# Patient Record
Sex: Female | Born: 1990 | Race: White | Hispanic: No | Marital: Married | Smoking: Former smoker
Health system: Southern US, Community
[De-identification: ages and names within clinical notes are randomized; demographics above are authoritative.]

## PROBLEM LIST (undated history)

## (undated) DIAGNOSIS — Z789 Other specified health status: Secondary | ICD-10-CM

## (undated) HISTORY — PX: CHOLECYSTECTOMY: SHX55

---

## 2017-10-15 ENCOUNTER — Other Ambulatory Visit (HOSPITAL_COMMUNITY): Payer: Self-pay | Admitting: Obstetrics and Gynecology

## 2017-10-15 DIAGNOSIS — Z3689 Encounter for other specified antenatal screening: Secondary | ICD-10-CM

## 2017-10-15 DIAGNOSIS — Z3A21 21 weeks gestation of pregnancy: Secondary | ICD-10-CM

## 2017-10-15 DIAGNOSIS — Z8774 Personal history of (corrected) congenital malformations of heart and circulatory system: Secondary | ICD-10-CM

## 2017-10-17 ENCOUNTER — Encounter (HOSPITAL_COMMUNITY): Payer: Self-pay | Admitting: *Deleted

## 2017-10-19 ENCOUNTER — Encounter (HOSPITAL_COMMUNITY): Payer: Self-pay

## 2017-10-19 ENCOUNTER — Ambulatory Visit (HOSPITAL_COMMUNITY)
Admission: RE | Admit: 2017-10-19 | Discharge: 2017-10-19 | Disposition: A | Discharge status: Home / Self Care | Payer: BLUE CROSS/BLUE SHIELD | Source: Ambulatory Visit | Attending: Obstetrics and Gynecology | Admitting: Obstetrics and Gynecology

## 2017-10-19 ENCOUNTER — Other Ambulatory Visit (HOSPITAL_COMMUNITY): Payer: Self-pay | Admitting: *Deleted

## 2017-10-19 ENCOUNTER — Other Ambulatory Visit (HOSPITAL_COMMUNITY): Payer: Self-pay | Admitting: Obstetrics and Gynecology

## 2017-10-19 DIAGNOSIS — Z8759 Personal history of other complications of pregnancy, childbirth and the puerperium: Secondary | ICD-10-CM

## 2017-10-19 DIAGNOSIS — O09292 Supervision of pregnancy with other poor reproductive or obstetric history, second trimester: Secondary | ICD-10-CM | POA: Insufficient documentation

## 2017-10-19 DIAGNOSIS — Z3A21 21 weeks gestation of pregnancy: Secondary | ICD-10-CM | POA: Diagnosis not present

## 2017-10-19 DIAGNOSIS — O09299 Supervision of pregnancy with other poor reproductive or obstetric history, unspecified trimester: Secondary | ICD-10-CM

## 2017-10-19 DIAGNOSIS — Z3689 Encounter for other specified antenatal screening: Secondary | ICD-10-CM

## 2017-10-19 DIAGNOSIS — Z8774 Personal history of (corrected) congenital malformations of heart and circulatory system: Secondary | ICD-10-CM

## 2017-10-19 DIAGNOSIS — Z7982 Long term (current) use of aspirin: Secondary | ICD-10-CM | POA: Diagnosis not present

## 2017-10-19 DIAGNOSIS — Z8279 Family history of other congenital malformations, deformations and chromosomal abnormalities: Secondary | ICD-10-CM | POA: Insufficient documentation

## 2017-10-19 HISTORY — DX: Other specified health status: Z78.9

## 2017-10-19 NOTE — Consult Note (Signed)
MFM Consult, Staff Note:   By way of consultation, I spoke to Mrs. Kunda regarding her pregnancy history of both intrauterine fetal demise as well as the history of prior child with congenital heart defect.  I explained to her that her obstetrical history places her at increased risk for recurrence of intrauterine growth restriction as well as intrauterine fetal demise.  In addition, her pregnancy history is suggestive for antiphospholipid antibody syndrome.    I explained to her that because she has a history of intrauterine intrauterine fetal demise, there is actually a preventive medicine to possibly reduce incidence of recurrence and subsequent pregnancy namely this one.  This medication is called low-dose aspirin 81 mg tablet taken daily. Accordingly, she was informed me that you had already placed her on  this medication and that she knows to take it until [redacted] weeks gestation.  I also spoke to her at length about the implications of antiphospholipid syndrome in pregnancy.  As you well know, women with this diagnosis are at substantial risk for the development of thromboembolism as well as adverse pregnancy complications such as recurrent miscarriage, stillbirth, placental insufficiency and preeclampsia.  The diagnosis of antiphospholipid syndrome should be based upon the presence of one of these clinical factors as well as a positive laboratory test for one of the antiphospholipid antibodies such as the lupus anticoagulant, anticardiolipin antibody or anti-beta 2-glycoprotein antibodies.   We accordingly ordered an APS laboratory panel today and should she have this antibody present, I would presumptively recommend initiation of low molecular weight heparin (LMWH or Lovenox) at 40mg   qd at least until a 2nd confirmatory APS panel was achieved to fully establish the diagnosis of antiphospholipid antibody syndrome.  That being said, I am not beginning this medication today in hopes that she does  not need the LMWH and that her test may be negative.  I feel that in absence of prior hx of a VTE, deferring initiation of such medication until a positive result is demonstrated is safest.  Pregnancy management in setting of hx of IUFD typically includes regular prenatal visits throughout pregnancy, serial ultrasound measurements of fetal growth and antenatal surveillance at around 32 weeks of gestation.  Delivery at 39 weeks of pregnancy should be anticipated.      SUMMARY OF RECOMMENDATIONS: 1.  Continuation of aspirin 81 mg orally daily now until 37 weeks to help prevention of intrauterine growth restriction and intrauterine fetal demise. 2.  We drew APS panel today to ensure that she does not have APS syndrome 3.  We have arranged for her to have interval growth ultrasounds every 4 weeks throughout the mid and late trimesters of pregnancy and these have been arranged at 24 weeks of gestation all the way through delivery. 4.  Antenatal testing should begin at 32 weeks. 5. Given that she has a prior child with a heart defect, I arranged for a screening fetal echocardiogram.   6.  Lastly, we recommend delivery by 39 weeks of gestational age.    TIME OF CONSULTATION:   30 minutes were spent in evaluation and counseling of your patient, more than 50% of this time was spent in direct face-to-face counseling.  Thank you for allowing us to contribute to the care of your patient.    Thank you,  Louann SjogrenJeffrey Morgan Gaynelle Arabianenney   Denney, Louann SjogrenJeffrey Morgan, MD, MS, FACOG Assistant Professor Section of Maternal-Fetal Medicine [facility]

## 2017-10-19 NOTE — Consult Note (Signed)
MFM Consult, Staff Note:   By way of consultation, I spoke to Kellie Krause regarding her pregnancy history of both intrauterine fetal demise as well as the history of prior child with congenital heart defect.  I explained to her that her obstetrical history places her at increased risk for recurrence of intrauterine growth restriction as well as intrauterine fetal demise.  In addition, her pregnancy history is suggestive for antiphospholipid antibody syndrome.    I explained to her that because she has a history of intrauterine intrauterine fetal demise, there is actually a preventive medicine to possibly reduce incidence of recurrence and subsequent pregnancy namely this one.  This medication is called low-dose aspirin 81 mg tablet taken daily. Accordingly, she was informed me that you had already placed her on  this medication and that she knows to take it until [redacted] weeks gestation.  I also spoke to her at length about the implications of antiphospholipid syndrome in pregnancy.  As you well know, women with this diagnosis are at substantial risk for the development of thromboembolism as well as adverse pregnancy complications such as recurrent miscarriage, stillbirth, placental insufficiency and preeclampsia.  The diagnosis of antiphospholipid syndrome should be based upon the presence of one of these clinical factors as well as a positive laboratory test for one of the antiphospholipid antibodies such as the lupus anticoagulant, anticardiolipin antibody or anti-beta 2-glycoprotein antibodies.   We accordingly ordered an APS laboratory panel today and should she have this antibody present, I would presumptively recommend initiation of low molecular weight heparin (LMWH or Lovenox) at 40mg [place] qd at least until a 2nd confirmatory APS panel was achieved to fully establish the diagnosis of antiphospholipid antibody syndrome.  That being said, I am not beginning this medication today in hopes that she does  not need the LMWH and that her test may be negative.  I feel that in absence of prior hx of a VTE, deferring initiation of such medication until a positive result is demonstrated is safest.  Pregnancy management in setting of hx of IUFD typically includes regular prenatal visits throughout pregnancy, serial ultrasound measurements of fetal growth and antenatal surveillance at around 32 weeks of gestation.  Delivery at 39 weeks of pregnancy should be anticipated.      SUMMARY OF RECOMMENDATIONS: 1.  Continuation of aspirin 81 mg orally daily now until 37 weeks to help prevention of intrauterine growth restriction and intrauterine fetal demise. 2.  We drew APS panel today to ensure that she does not have APS syndrome 3.  We have arranged for her to have interval growth ultrasounds every 4 weeks throughout the mid and late trimesters of pregnancy and these have been arranged at 24 weeks of gestation all the way through delivery. 4.  Antenatal testing should begin at 32 weeks. 5. Given that she has a prior child with a heart defect, I arranged for a screening fetal echocardiogram.   6.  Lastly, we recommend delivery by 39 weeks of gestational age.    TIME OF CONSULTATION:   30 minutes were spent in evaluation and counseling of your patient, more than 50% of this time was spent in direct face-to-face counseling.  Thank you for allowing us to contribute to the care of your patient.    Thank you,  Tremar Wickens Morgan Rosaleigh Brazzel   Carlito Bogert Morgan, MD, MS, FACOG Assistant Professor Section of Maternal-Fetal Medicine [employer]   

## 2017-10-20 LAB — LUPUS ANTICOAGULANT PANEL
DRVVT: 41.8 s (ref 0.0–47.0)
PTT Lupus Anticoagulant: 44.4 s (ref 0.0–51.9)

## 2017-10-22 ENCOUNTER — Other Ambulatory Visit (HOSPITAL_COMMUNITY): Payer: Self-pay

## 2017-10-22 ENCOUNTER — Encounter (HOSPITAL_COMMUNITY): Payer: Self-pay

## 2017-10-22 LAB — BETA-2-GLYCOPROTEIN I ABS, IGG/M/A: Beta-2 Glyco I IgG: <9 GPI IgG units (ref 0–20)

## 2017-10-22 LAB — CARDIOLIPIN ANTIBODIES, IGG, IGM, IGA
Anticardiolipin IgA: <9 APL U/mL (ref 0–11)
Anticardiolipin IgG: <9 GPL U/mL (ref 0–14)
Anticardiolipin IgM: <9 MPL U/mL (ref 0–12)

## 2017-10-23 ENCOUNTER — Telehealth (HOSPITAL_COMMUNITY): Payer: Self-pay | Admitting: *Deleted

## 2017-10-23 NOTE — Telephone Encounter (Signed)
Pt called regarding lab results drawn 10/19/17.  Lab results reviewed with Dr. Ezzard StandingNewman, results are normal.  Pt name and DOB verified, normal results given.  Pt had no further questions.

## 2017-11-15 ENCOUNTER — Other Ambulatory Visit (HOSPITAL_COMMUNITY): Payer: Self-pay | Admitting: Obstetrics and Gynecology

## 2017-11-15 ENCOUNTER — Encounter (HOSPITAL_COMMUNITY): Payer: Self-pay

## 2017-11-15 ENCOUNTER — Ambulatory Visit (HOSPITAL_COMMUNITY)
Admission: RE | Admit: 2017-11-15 | Discharge: 2017-11-15 | Disposition: A | Discharge status: Home / Self Care | Payer: BLUE CROSS/BLUE SHIELD | Source: Ambulatory Visit | Attending: Obstetrics and Gynecology | Admitting: Obstetrics and Gynecology

## 2017-11-15 DIAGNOSIS — O09299 Supervision of pregnancy with other poor reproductive or obstetric history, unspecified trimester: Secondary | ICD-10-CM

## 2017-11-15 DIAGNOSIS — O09292 Supervision of pregnancy with other poor reproductive or obstetric history, second trimester: Secondary | ICD-10-CM | POA: Diagnosis present

## 2017-11-15 DIAGNOSIS — Z3A25 25 weeks gestation of pregnancy: Secondary | ICD-10-CM | POA: Diagnosis not present

## 2017-11-15 DIAGNOSIS — Z362 Encounter for other antenatal screening follow-up: Secondary | ICD-10-CM | POA: Diagnosis not present

## 2017-12-14 ENCOUNTER — Ambulatory Visit (HOSPITAL_COMMUNITY): Payer: BLUE CROSS/BLUE SHIELD

## 2018-01-11 ENCOUNTER — Ambulatory Visit (HOSPITAL_COMMUNITY): Payer: BLUE CROSS/BLUE SHIELD

## 2018-02-08 ENCOUNTER — Ambulatory Visit (HOSPITAL_COMMUNITY): Payer: BLUE CROSS/BLUE SHIELD

## 2018-05-23 ENCOUNTER — Encounter (HOSPITAL_COMMUNITY): Payer: Self-pay

## 2018-07-23 IMAGING — US US MFM OB DETAIL+14 WK
1 series · 14 of 28 positions shown · non-contrast
Comparison: none

[Series 1: us mfm ob detail+14 wk · 78 acquisitions, 14 frames shown]
[im 3/78]
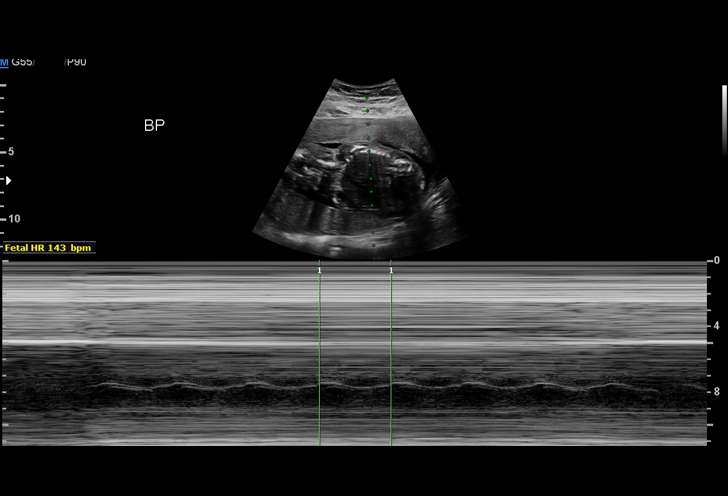
[im 9/78]
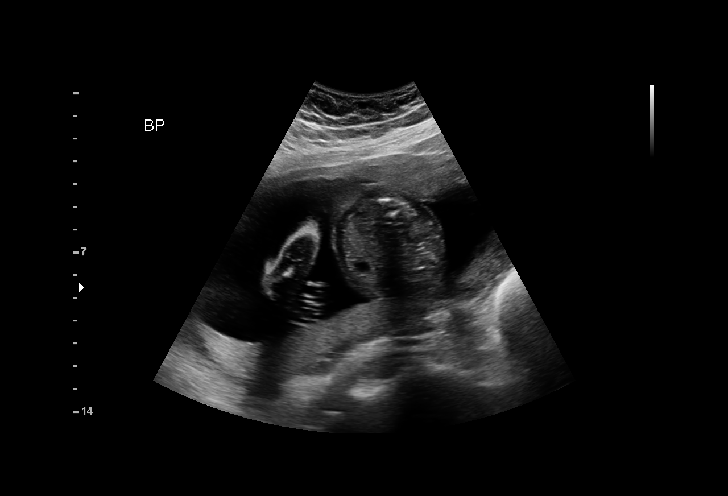
[im 15/78]
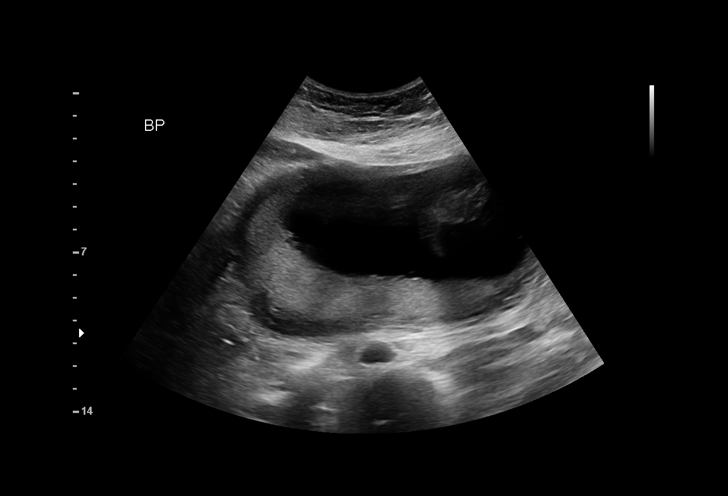
[im 20/78]
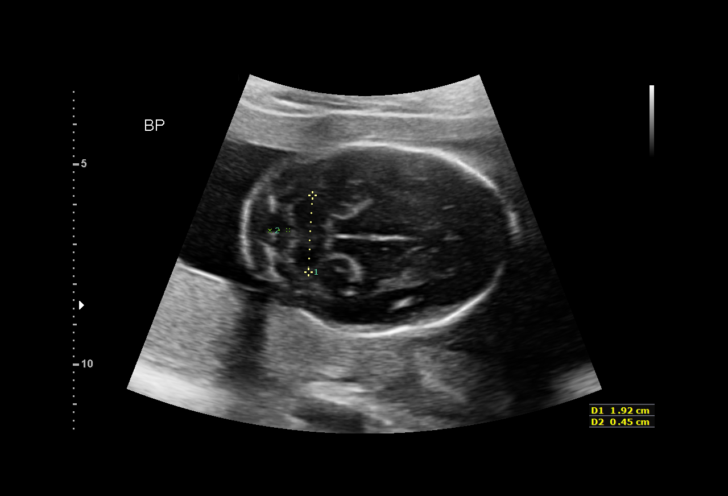
[im 26/78]
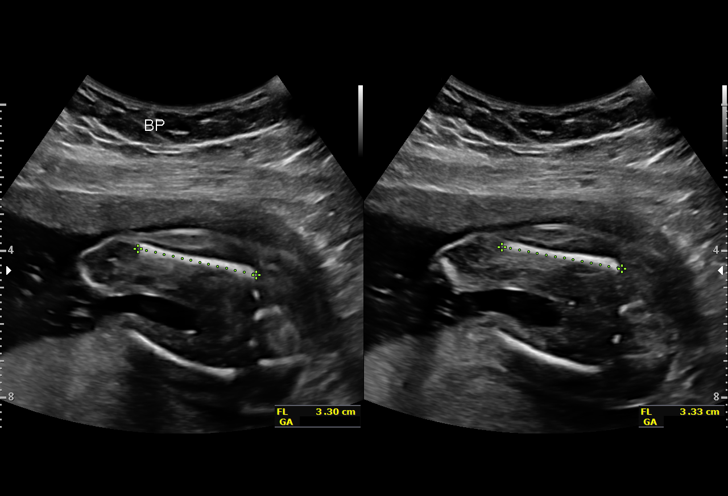
[im 32/78]
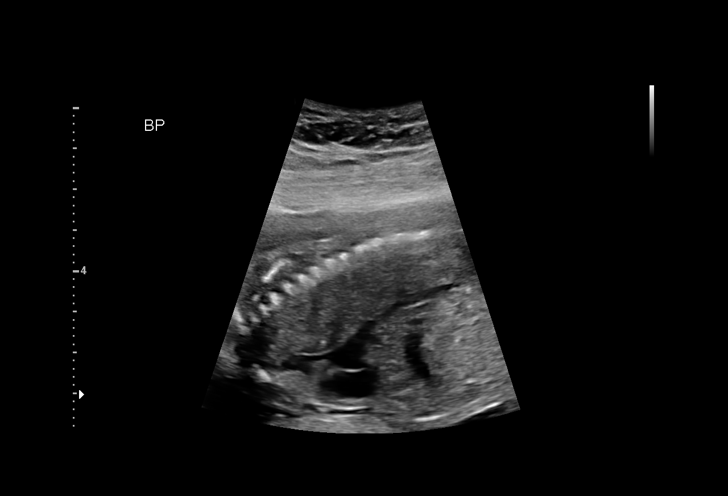
[im 38/78]
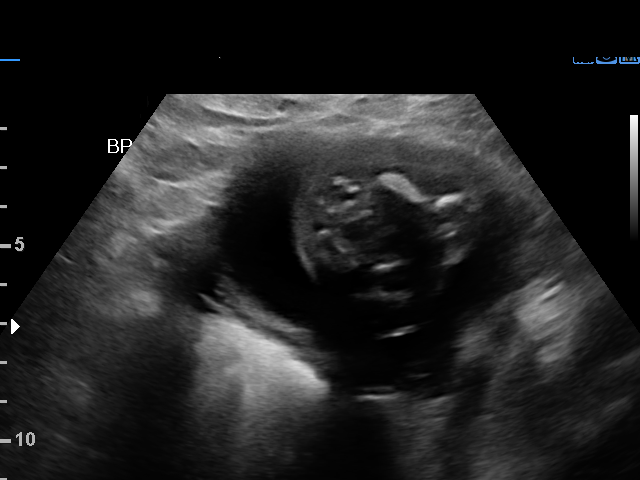
[im 43/78]
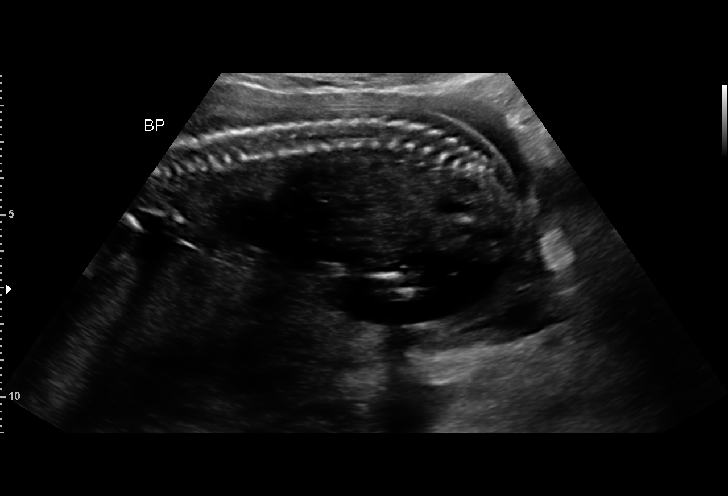
[im 49/78]
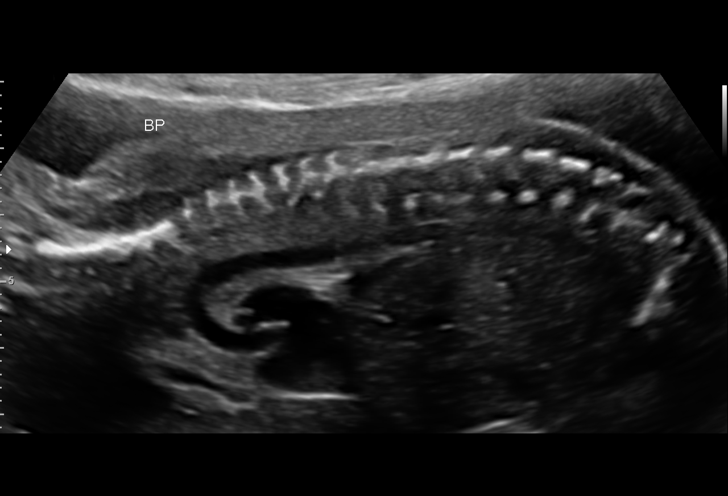
[im 55/78]
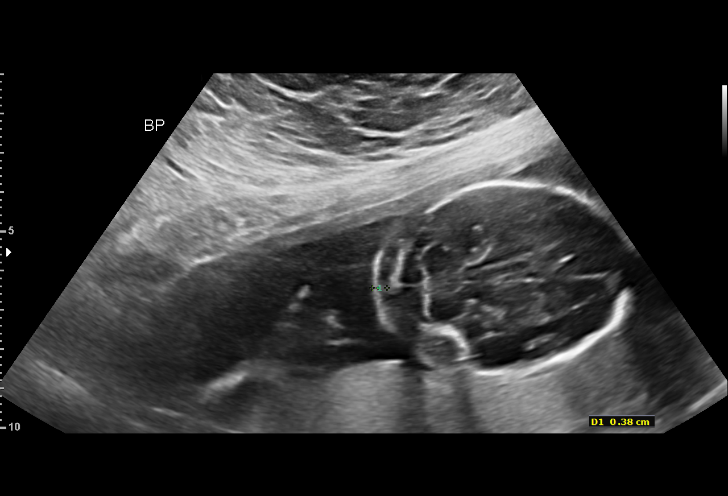
[im 60/78]
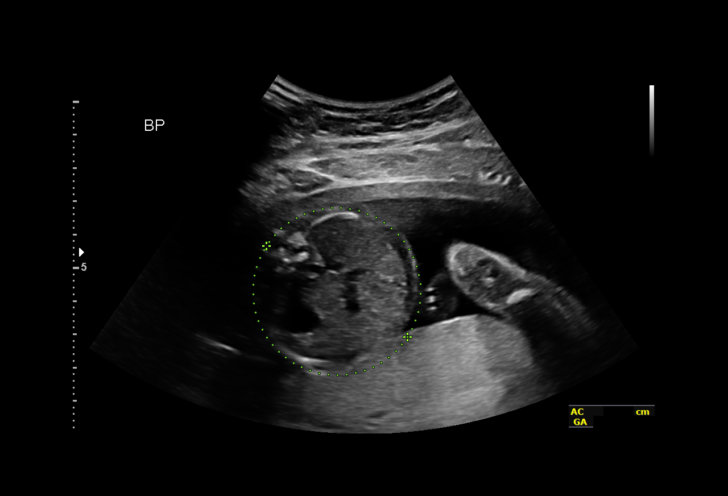
[im 66/78]
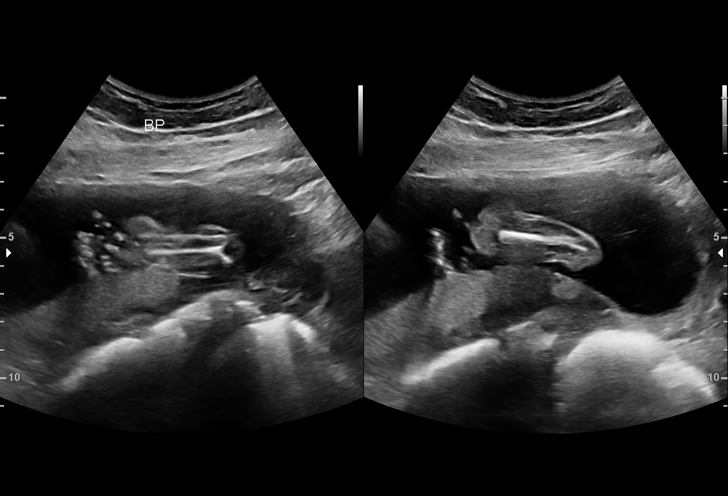
[im 72/78]
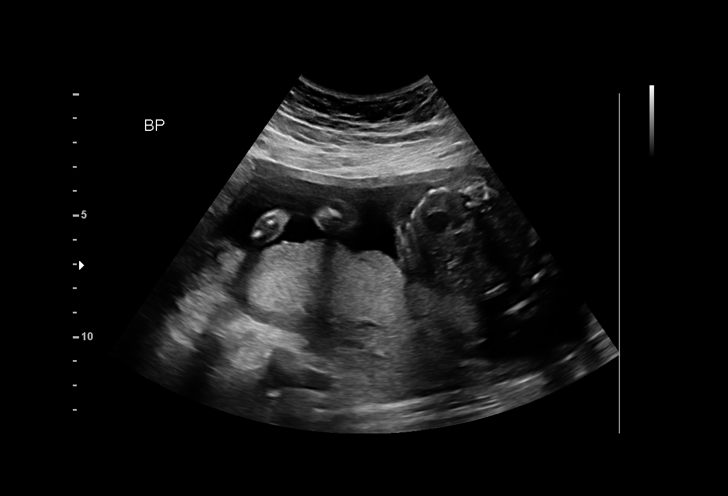
[im 78/78]
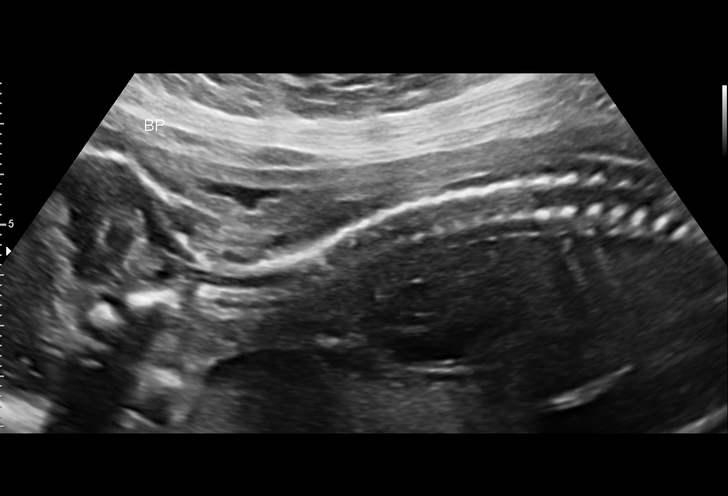

[14 of 28 positions shown; findings below may reference images not displayed]

1  OLIMPIA TIGER          [id number]      [id number]     [id number]
Indications

21 weeks gestation of pregnancy
Encounter for fetal anatomic survey
Poor obstetric history: Previous IUFD
(stillbirth, 28 weeks)
Previous pregnacy with congenital heart
(cardiac) defect (coarctation of aorta)
OB History

Gravidity:    4         Term:   1        Prem:   1        SAB:   1
TOP:          0       Ectopic:  0        Living: 1
Fetal Evaluation

Num Of Fetuses:     1
Fetal Heart         143
Rate(bpm):
Cardiac Activity:   Observed
Presentation:       Variable
Placenta:           Posterior, above cervical os
P. Cord Insertion:  Visualized

Amniotic Fluid
AFI FV:      Subjectively within normal limits

Largest Pocket(cm)
5.65
Biometry

BPD:      50.1  mm     G. Age:  21w 1d         50  %    CI:         73.9   %    70 - 86
FL/HC:      17.8   %    15.9 -
HC:      185.1  mm     G. Age:  20w 6d         28  %    HC/AC:      1.14        1.06 -
AC:      162.8  mm     G. Age:  21w 3d         50  %    FL/BPD:     65.7   %
FL:       32.9  mm     G. Age:  20w 2d         16  %    FL/AC:      20.2   %    20 - 24
HUM:      33.4  mm     G. Age:  21w 2d         51  %

Est. FW:     384  gm    0 lb 14 oz      39  %
Gestational Age

LMP:           21w 3d        Date:  05/22/17                 EDD:   02/26/18
U/S Today:     21w 0d                                        EDD:   03/01/18
Best:          21w 1d     Det. By:  Early Ultrasound         EDD:   02/28/18
(07/10/17)
Anatomy

Cranium:               Appears normal         Aortic Arch:            Appears normal
Cavum:                 Appears normal         Ductal Arch:            Not well visualized
Ventricles:            Appears normal         Diaphragm:              Appears normal
Choroid Plexus:        Appears normal         Stomach:                Appears normal, left
sided
Cerebellum:            Appears normal         Abdomen:                Appears normal
Posterior Fossa:       Appears normal         Abdominal Wall:         Appears nml (cord
insert, abd wall)
Nuchal Fold:           Not applicable (>20    Cord Vessels:           Appears normal (3
wks GA)                                        vessel cord)
Face:                  Orbits nl; profile not Kidneys:                Appear normal
well visualized
Lips:                  Appears normal         Bladder:                Appears normal
Thoracic:              Appears normal         Spine:                  Appears normal
Heart:                 Not well visualized    Upper Extremities:      Appears normal
RVOT:                  Not well visualized    Lower Extremities:      Appears normal
LVOT:                  Appears normal

Other:  Fetus appears to be a male. Heels visualized.
Cervix Uterus Adnexa

Cervix
Length:            3.9  cm.
Normal appearance by transabdominal scan.
Impression

Single living intrauterine pregnancy at 21w 1d, hx IUFD at 28
wks, hx prior child with coarctation of the aorta
active fetus
Placenta Posterior, above cervical os.
Appropriate fetal growth.
Normal amniotic fluid volume.
The fetal anatomic survey is not complete.
No gross fetal anomalies identified.
cervix is long and closed
Recommendations

See MFM consultation
fetal echocardiogram
Antiphospholipid antibody panel drawn today
ASA 81mg po qd until 37 weeks.
Interval growth monthly
begin antenatal testing at 32 weeks

## 2018-08-19 IMAGING — US US MFM OB FOLLOW-UP
1 series · 14 of 28 positions shown · non-contrast
Comparison: none

[Series 1: us mfm ob follow-up · 14 of 38 slices shown]
[im 2/38]
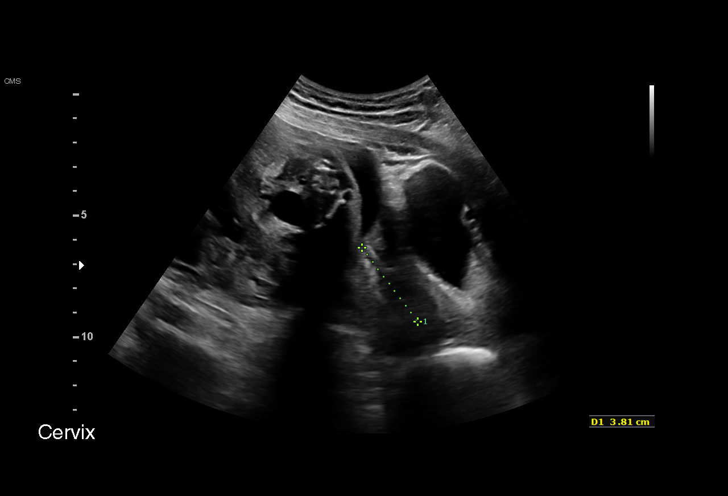
[im 5/38]
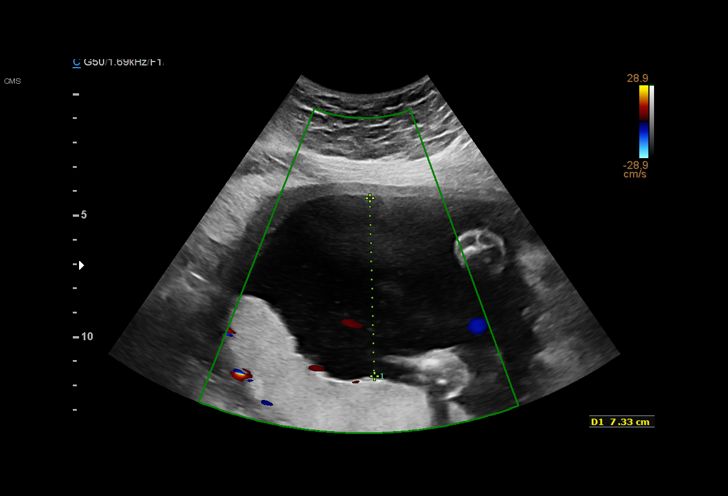
[im 7/38]
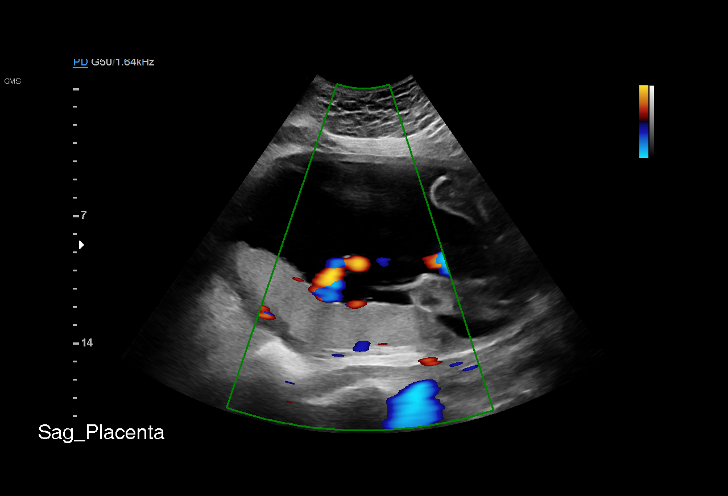
[im 10/38]
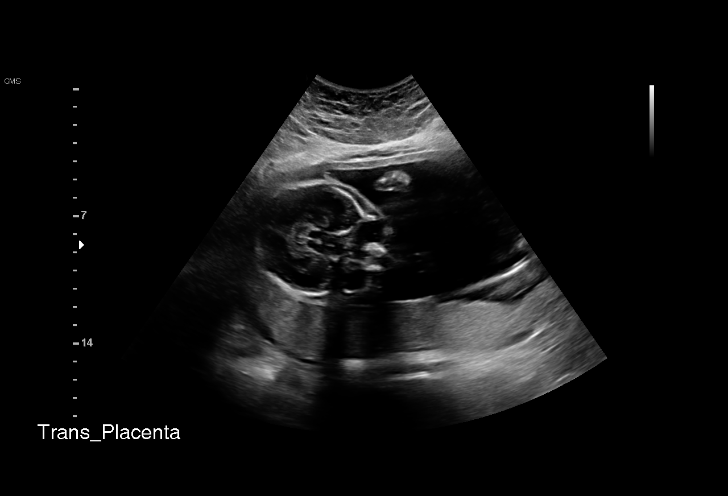
[im 13/38]
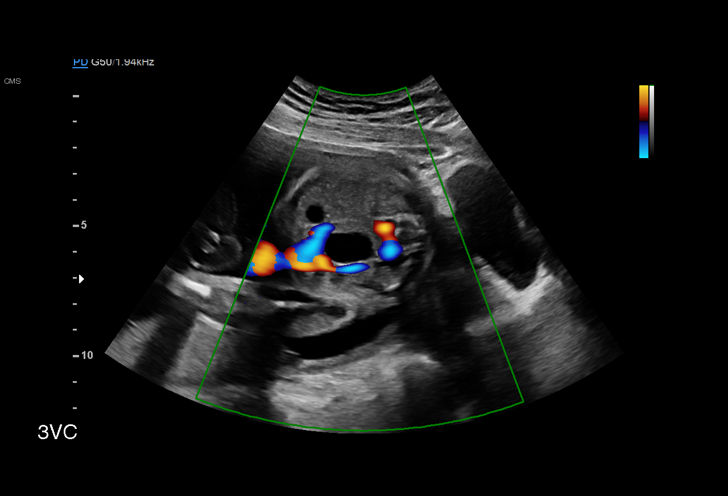
[im 16/38]
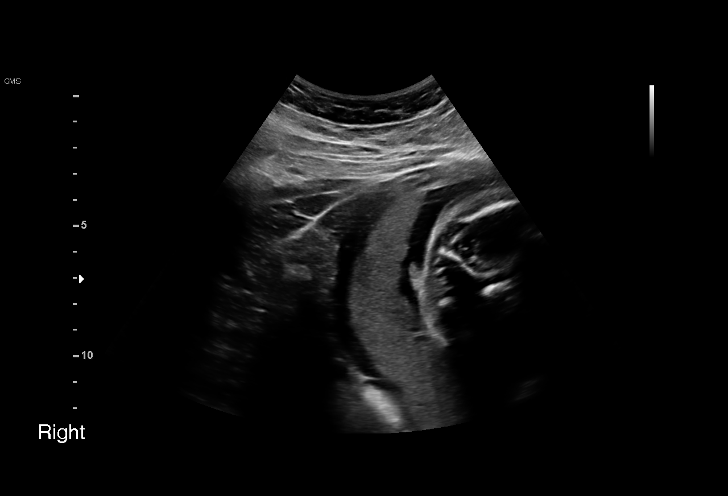
[im 18/38]
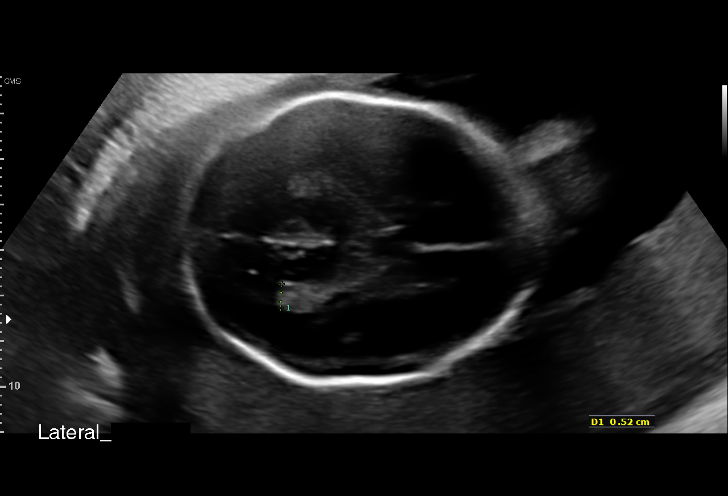
[im 21/38]
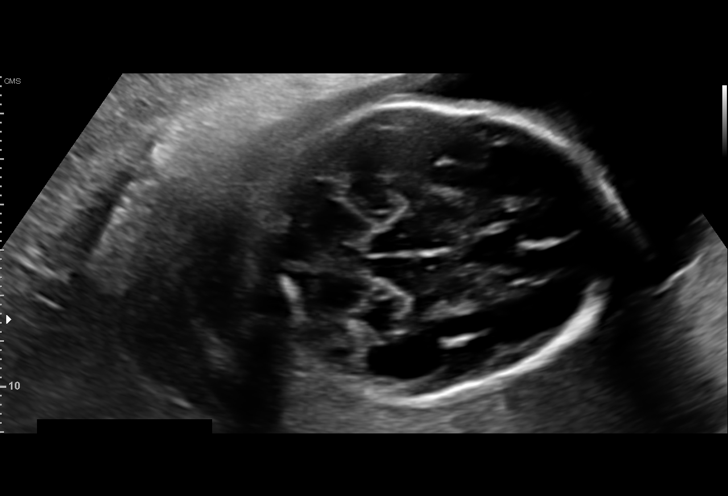
[im 24/38]
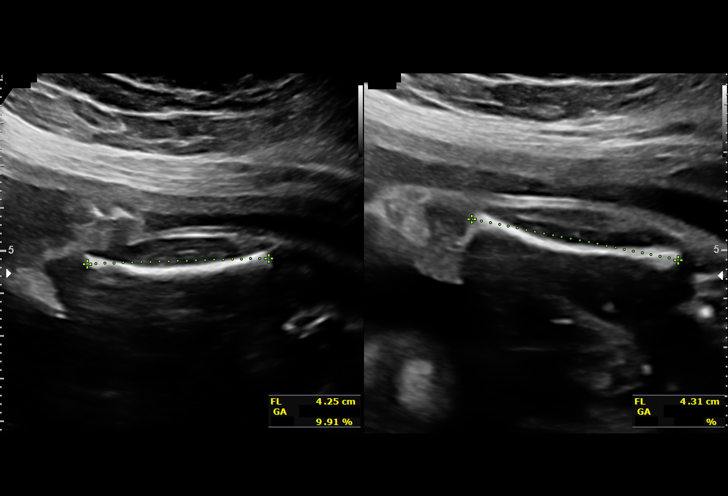
[im 27/38]
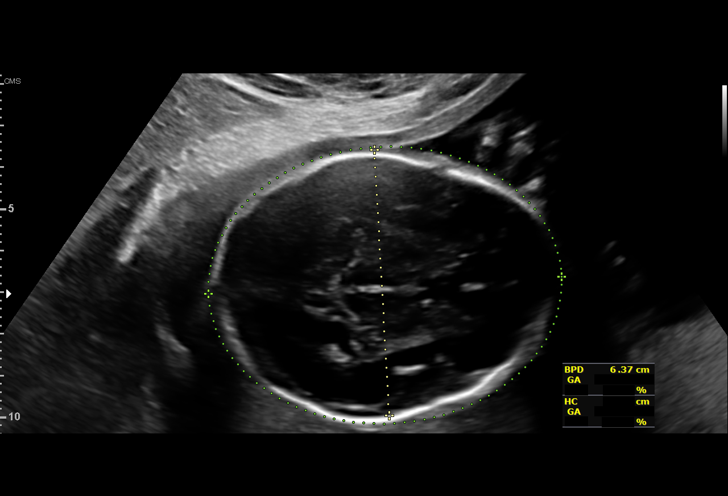
[im 29/38]
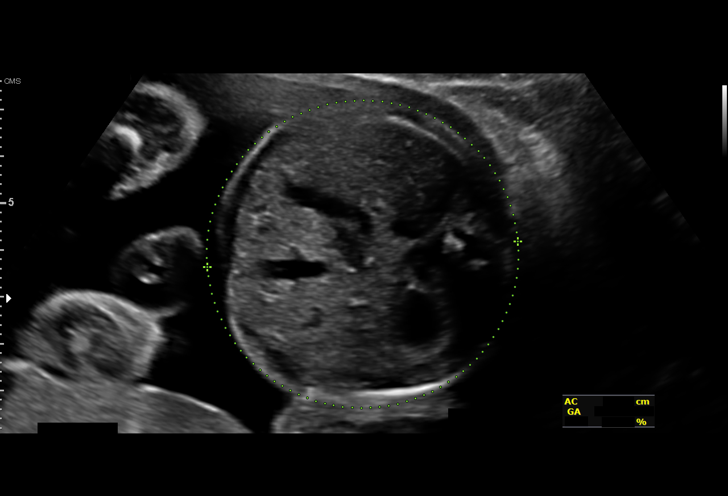
[im 32/38]
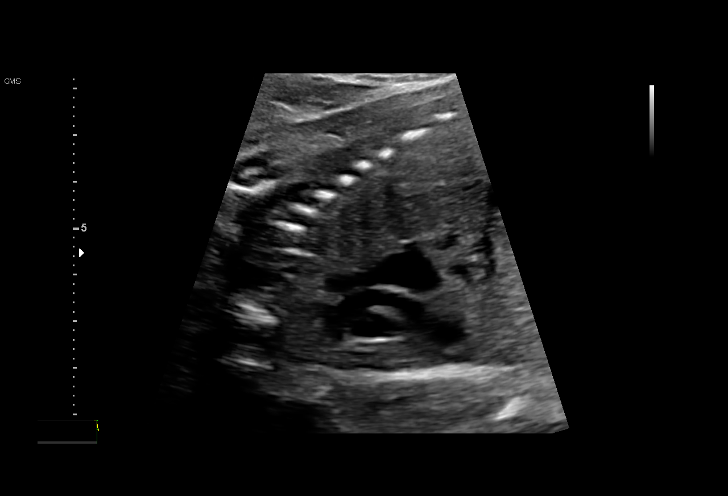
[im 35/38]
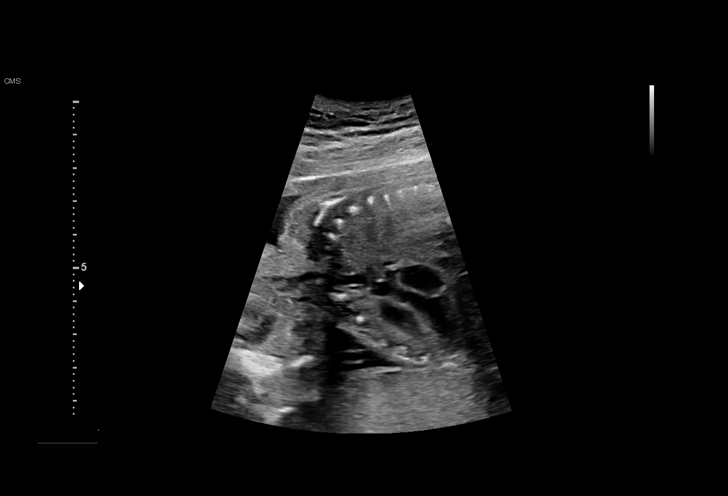
[im 38/38]
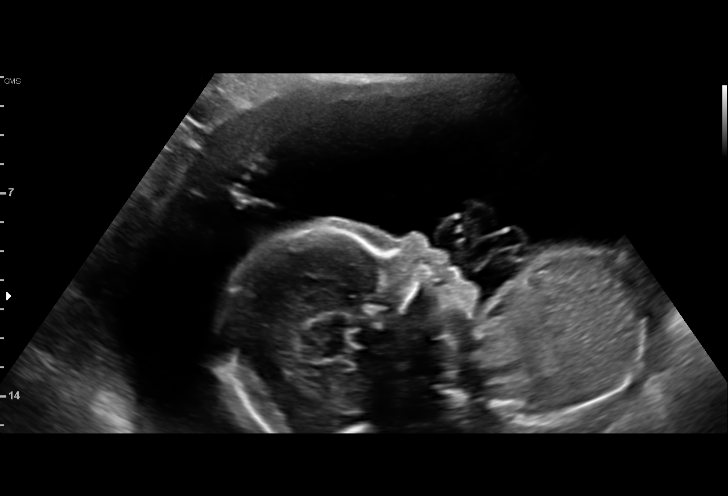

[14 of 28 positions shown; findings below may reference images not displayed]

Indications

25 weeks gestation of pregnancy
Poor obstetric history: Previous IUFD
(stillbirth, 28 weeks from abruption)
Previous pregnacy with congenital heart
(cardiac) defect (coarctation of aorta)
Encounter for other antenatal screening
follow-up
OB History

Gravidity:    4         Term:   1        Prem:   1        SAB:   1
TOP:          0       Ectopic:  0        Living: 1
Fetal Evaluation

Num Of Fetuses:     1
Cardiac Activity:   Observed
Presentation:       [place]ree[place]
Placenta:           Posterior, above cervical os
P. Cord Insertion:  Visualized

Amniotic Fluid
AFI FV:      Subjectively within normal limits

Largest Pocket(cm)
7.3
Biometry

BPD:      64.4  mm     G. Age:  26w 0d         77  %    CI:        74.46   %    70 - 86
FL/HC:      18.1   %    18.7 -
HC:      236.9  mm     G. Age:  25w 5d         56  %    HC/AC:      1.14        1.04 -
AC:      207.6  mm     G. Age:  25w 3d         51  %    FL/BPD:     66.5   %    71 - 87
FL:       42.8  mm     G. Age:  24w 0d         12  %    FL/AC:      20.6   %    20 - 24
HUM:      42.1  mm     G. Age:  25w 2d         51  %

Est. FW:     749  gm    1 lb 10 oz      52  %
Gestational Age

LMP:           25w 2d        Date:  05/22/17                 EDD:   02/26/18
U/S Today:     25w 2d                                        EDD:   02/26/18
Best:          25w 0d     Det. By:  Early Ultrasound         EDD:   02/28/18
(07/10/17)
Anatomy

Cranium:               Appears normal         Aortic Arch:            Previously seen
Cavum:                 Appears normal         Ductal Arch:            Not well visualized
Ventricles:            Appears normal         Diaphragm:              Previously seen
Choroid Plexus:        Appears normal         Stomach:                Appears normal, left
sided
Cerebellum:            Appears normal         Abdomen:                Appears normal
Posterior Fossa:       Appears normal         Abdominal Wall:         Appears nml (cord
insert, abd wall)
Nuchal Fold:           Not applicable (>20    Cord Vessels:           Appears normal (3
wks GA)                                        vessel cord)
Face:                  Profile nl; orbits     Kidneys:                Appear normal
prev visualized
Lips:                  Appears normal         Bladder:                Appears normal
Thoracic:              Appears normal         Spine:                  Previously seen
Heart:                 Not well visualized    Upper Extremities:      Previously seen
RVOT:                  Appears normal         Lower Extremities:      Previously seen
LVOT:                  Appears normal

Other:  Fetus appears to be a male. Heels previously visualized. Nasal bone
visualized. Technically difficult due to fetal position.
Cervix Uterus Adnexa

Cervix
Length:            3.8  cm.
Normal appearance by transabdominal scan.

Uterus
No abnormality visualized.

Left Ovary
Not visualized.

Right Ovary
Not visualized.

Cul De Sac:   No free fluid seen.

Adnexa:       No abnormality visualized.
Impression

SIUP at 25+0 weeks
Normal interval anatomy; anatomic survey complete except
for 4-chamber and DA
Normal amniotic fluid volume
Appropriate interval growth with EFW at the 52nd %tile

Fetal ECHO today: normal
Recommendations

Serial ultrasounds for growth (previous abruption/IUFD)
